# Patient Record
Sex: Female | Born: 2000 | Race: Black or African American | Hispanic: No | Marital: Single | Smoking: Never smoker
Health system: Southern US, Community
[De-identification: ages and names within clinical notes are randomized; demographics above are authoritative.]

## PROBLEM LIST (undated history)

## (undated) DIAGNOSIS — J45909 Unspecified asthma, uncomplicated: Secondary | ICD-10-CM

## (undated) HISTORY — PX: NO PAST SURGERIES: SHX2092

---
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
## 2019-05-17 ENCOUNTER — Other Ambulatory Visit: Payer: Self-pay

## 2019-05-17 DIAGNOSIS — Z20822 Contact with and (suspected) exposure to covid-19: Secondary | ICD-10-CM

## 2019-05-18 LAB — NOVEL CORONAVIRUS, NAA: SARS-CoV-2, NAA: NOT DETECTED

## 2020-06-14 ENCOUNTER — Other Ambulatory Visit: Payer: Self-pay

## 2020-06-14 ENCOUNTER — Emergency Department: Payer: Medicaid Other

## 2020-06-14 ENCOUNTER — Emergency Department
Admission: EM | Admit: 2020-06-14 | Discharge: 2020-06-14 | Disposition: A | Discharge status: Home / Self Care | Payer: Medicaid Other | Attending: Emergency Medicine | Admitting: Emergency Medicine

## 2020-06-14 ENCOUNTER — Encounter: Payer: Self-pay | Admitting: Emergency Medicine

## 2020-06-14 DIAGNOSIS — S199XXA Unspecified injury of neck, initial encounter: Secondary | ICD-10-CM | POA: Diagnosis present

## 2020-06-14 DIAGNOSIS — Y939 Activity, unspecified: Secondary | ICD-10-CM | POA: Insufficient documentation

## 2020-06-14 DIAGNOSIS — Y999 Unspecified external cause status: Secondary | ICD-10-CM | POA: Insufficient documentation

## 2020-06-14 DIAGNOSIS — J45909 Unspecified asthma, uncomplicated: Secondary | ICD-10-CM | POA: Insufficient documentation

## 2020-06-14 DIAGNOSIS — Y9289 Other specified places as the place of occurrence of the external cause: Secondary | ICD-10-CM | POA: Insufficient documentation

## 2020-06-14 DIAGNOSIS — S161XXA Strain of muscle, fascia and tendon at neck level, initial encounter: Secondary | ICD-10-CM | POA: Diagnosis not present

## 2020-06-14 MED ORDER — BACLOFEN 10 MG PO TABS: 10.0000 mg | ORAL_TABLET | Freq: Three times a day (TID) | ORAL | 0 refills | Status: AC

## 2020-06-14 MED ORDER — MELOXICAM 15 MG PO TABS: 15.0000 mg | ORAL_TABLET | Freq: Every day | ORAL | 2 refills | Status: AC
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
## 2020-06-14 NOTE — Discharge Instructions (Signed)
Follow-up with orthopedics if not improving in 5 to 7 days.  Take medication as prescribed.  Apply ice to any areas that hurt.  In 3 days she can switch to wet heat followed by ice.  Return to the emergency department worsening

## 2020-12-21 ENCOUNTER — Other Ambulatory Visit (HOSPITAL_COMMUNITY)
Admission: RE | Admit: 2020-12-21 | Discharge: 2020-12-21 | Disposition: A | Discharge status: Home / Self Care | Payer: Medicaid Other | Source: Ambulatory Visit | Attending: Obstetrics and Gynecology | Admitting: Obstetrics and Gynecology

## 2020-12-21 ENCOUNTER — Other Ambulatory Visit: Payer: Self-pay

## 2020-12-21 ENCOUNTER — Ambulatory Visit (INDEPENDENT_AMBULATORY_CARE_PROVIDER_SITE_OTHER): Payer: Medicaid Other | Admitting: Obstetrics and Gynecology

## 2020-12-21 ENCOUNTER — Encounter: Payer: Self-pay | Admitting: Obstetrics and Gynecology

## 2020-12-21 VITALS — BP 140/82 | Ht 71.0 in | Wt 138.0 lb

## 2020-12-21 DIAGNOSIS — Z113 Encounter for screening for infections with a predominantly sexual mode of transmission: Secondary | ICD-10-CM | POA: Diagnosis not present

## 2020-12-21 DIAGNOSIS — Z01419 Encounter for gynecological examination (general) (routine) without abnormal findings: Secondary | ICD-10-CM

## 2020-12-21 DIAGNOSIS — Z124 Encounter for screening for malignant neoplasm of cervix: Secondary | ICD-10-CM | POA: Insufficient documentation

## 2020-12-21 DIAGNOSIS — Z1331 Encounter for screening for depression: Secondary | ICD-10-CM

## 2020-12-21 DIAGNOSIS — Z3041 Encounter for surveillance of contraceptive pills: Secondary | ICD-10-CM

## 2020-12-21 DIAGNOSIS — Z1339 Encounter for screening examination for other mental health and behavioral disorders: Secondary | ICD-10-CM | POA: Diagnosis not present

## 2020-12-21 MED ORDER — SYEDA 3-0.03 MG PO TABS: 1.0000 | ORAL_TABLET | Freq: Every day | ORAL | 4 refills | Status: AC
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
## 2020-12-25 LAB — CYTOLOGY - PAP
Chlamydia: NEGATIVE
Comment: NEGATIVE
Comment: NEGATIVE
Comment: NORMAL
Diagnosis: NEGATIVE
Neisseria Gonorrhea: NEGATIVE
Trichomonas: NEGATIVE
